# Patient Record
Sex: Male | Born: 2005 | Hispanic: No | Smoking: Never smoker
Health system: Southern US, Community
[De-identification: ages and names within clinical notes are randomized; demographics above are authoritative.]

## PROBLEM LIST (undated history)

## (undated) ENCOUNTER — Emergency Department (HOSPITAL_COMMUNITY): Admission: EM | Payer: Medicaid Other | Source: Home / Self Care

## (undated) DIAGNOSIS — D75A Glucose-6-phosphate dehydrogenase (G6PD) deficiency without anemia: Secondary | ICD-10-CM

## (undated) HISTORY — DX: Glucose-6-phosphate dehydrogenase (G6PD) deficiency without anemia: D75.A

---
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
## 2024-01-31 ENCOUNTER — Ambulatory Visit: Admitting: Dermatology
